# Patient Record
Sex: Male | Born: 2000 | Hispanic: No | Marital: Single | Smoking: Former smoker
Health system: Southern US, Community
[De-identification: ages and names within clinical notes are randomized; demographics above are authoritative.]

## PROBLEM LIST (undated history)

## (undated) DIAGNOSIS — J45909 Unspecified asthma, uncomplicated: Secondary | ICD-10-CM

---
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
## 2022-01-19 ENCOUNTER — Encounter: Payer: Self-pay | Admitting: Family Medicine

## 2022-01-19 ENCOUNTER — Ambulatory Visit (INDEPENDENT_AMBULATORY_CARE_PROVIDER_SITE_OTHER): Payer: Medicaid Other | Admitting: Family Medicine

## 2022-01-19 VITALS — BP 108/58 | HR 86 | Temp 97.6°F | Ht 74.0 in | Wt 139.0 lb

## 2022-01-19 DIAGNOSIS — Z9189 Other specified personal risk factors, not elsewhere classified: Secondary | ICD-10-CM

## 2022-01-19 DIAGNOSIS — Z8709 Personal history of other diseases of the respiratory system: Secondary | ICD-10-CM | POA: Diagnosis not present

## 2022-01-19 DIAGNOSIS — Z7689 Persons encountering health services in other specified circumstances: Secondary | ICD-10-CM

## 2022-01-19 NOTE — Progress Notes (Signed)
New Patient Office Visit  Subjective    Patient ID: Eric Reeves, male    DOB: [birth year]  Age: 21 y.o. MRN: [id number]  CC:  Chief Complaint  Patient presents with   Establish Care    Needs referral to orthodontist for braces    HPI Eric Reeves presents to establish care.  States he would like to get braces and requests referral to orthodontist.   Asthma as a child. No issues in years.    Works as Lawyer at [employer] Medications as of 01/19/2022  Medication Sig   [DISCONTINUED] albuterol (PROVENTIL HFA;VENTOLIN HFA) 108 (90 BASE) MCG/ACT inhaler Inhale 2 puffs into the lungs every 6 (six) hours as needed for wheezing.   [DISCONTINUED] albuterol (PROVENTIL HFA;VENTOLIN HFA) 108 (90 Base) MCG/ACT inhaler Inhale 1-2 puffs into the lungs every 6 (six) hours as needed for wheezing or shortness of breath.   [DISCONTINUED] albuterol (PROVENTIL) (5 MG/ML) 0.5% nebulizer solution Take 2.5 mg by nebulization every 6 (six) hours as needed for wheezing or shortness of breath.   [DISCONTINUED] loratadine (CLARITIN) 10 MG tablet Take 10 mg by mouth daily.   No facility-administered encounter medications on file as of 01/19/2022.    Past Medical History:  Diagnosis Date   Asthma     History reviewed. No pertinent surgical history.  Family History  Problem Relation Age of Onset   Asthma Father    Diabetes Maternal Grandmother    Hypertension Maternal Grandmother    Asthma Maternal Grandmother     Social History   Socioeconomic History   Marital status: Single    Spouse name: Not on file   Number of children: Not on file   Years of education: Not on file   Highest education level: Not on file  Occupational History   Not on file  Tobacco Use   Smoking status: Every Day    Types: Cigarettes   Smokeless tobacco: Never  Vaping Use   Vaping Use: Every day  Substance and Sexual Activity   Alcohol use: Never   Drug use: Not Currently     Types: Marijuana   Sexual activity: Yes    Birth control/protection: None  Other Topics Concern   Not on file  Social History Narrative   ** Merged History Encounter **       Social Determinants of Health   Financial Resource Strain: Not on file  Food Insecurity: Not on file  Transportation Needs: Not on file  Physical Activity: Not on file  Stress: Not on file  Social Connections: Not on file  Intimate Partner Violence: Not on file    ROS      Objective    BP (!) 108/58 (BP Location: Left Arm, Patient Position: Sitting, Cuff Size: Large)   Pulse 86   Temp 97.6 F (36.4 C) (Temporal)   Ht 6\' 2"  (1.88 m)   Wt 139 lb (63 kg)   BMI 17.85 kg/m   Physical Exam Constitutional:      General: He is not in acute distress.    Appearance: He is not ill-appearing.  HENT:     Mouth/Throat:     Lips: Pink. No lesions.     Mouth: Mucous membranes are moist.     Dentition: Abnormal dentition.     Pharynx: Oropharynx is clear.     Comments: 2 lower teeth are malaligned. No sign of caries or other abnormalities.  Eyes:     Conjunctiva/sclera: Conjunctivae normal.  Cardiovascular:     Rate and Rhythm: Normal rate and regular rhythm.  Pulmonary:     Effort: Pulmonary effort is normal.     Breath sounds: Normal breath sounds.  Musculoskeletal:     Cervical back: Normal range of motion and neck supple.  Lymphadenopathy:     Cervical: No cervical adenopathy.  Neurological:     Mental Status: He is alert.         Assessment & Plan:   Problem List Items Addressed This Visit       Other   History of asthma   Need for dental care - Primary   Relevant Orders   Ambulatory referral to Orthodontics   Other Visit Diagnoses     Encounter to establish care          He is a pleasant 21 year old male who is new to the practice and here to establish care.  Request referral to orthodontist and would like to get braces.  He has not seen a dentist in years and I recommend  scheduling appointment for dental exam.  His teeth appear to be in good shape but there are a couple that are malaligned. Denies any issues with asthma since childhood. Follow-up as needed.  Return if symptoms worsen or fail to improve.   Harland Dingwall, NP-C

## 2022-01-19 NOTE — Patient Instructions (Signed)
It was a pleasure meeting you today.  Thank you for trusting [place] with your health care.  I placed a referral to an orthodontist.  You may want to check with your insurance in regards to an orthodontist who is in your network.  You can call and schedule this appointment or let me know if you need the referral to a specific office.

## 2022-07-25 ENCOUNTER — Telehealth: Payer: Medicaid Other | Admitting: Physician Assistant

## 2022-07-25 DIAGNOSIS — B9689 Other specified bacterial agents as the cause of diseases classified elsewhere: Secondary | ICD-10-CM | POA: Diagnosis not present

## 2022-07-25 DIAGNOSIS — J019 Acute sinusitis, unspecified: Secondary | ICD-10-CM

## 2022-07-25 MED ORDER — FLUTICASONE PROPIONATE 50 MCG/ACT NA SUSP: 2.0000 | Freq: Every day | NASAL | 0 refills | Status: DC

## 2022-07-25 MED ORDER — DOXYCYCLINE HYCLATE 100 MG PO TABS: 100.0000 mg | ORAL_TABLET | Freq: Two times a day (BID) | ORAL | 0 refills | Status: DC

## 2022-07-25 NOTE — Patient Instructions (Signed)
Kipp Laurence, thank you for joining Piedad Climes, PA-C for today's virtual visit.  While this provider is not your primary care provider (PCP), if your PCP is located in our provider database this encounter information will be shared with them immediately following your visit.   A [facility] [place] MyChart account gives you access to today's visit and all your visits, tests, and labs performed at [facility] " click here if you don't have a [facility] MyChart account or go to [email or web address]  Consent: (Patient) Esvin Hnat provided verbal consent for this virtual visit at the beginning of the encounter.  Current Medications:  Current Outpatient Medications:    doxycycline (VIBRA-TABS) 100 MG tablet, Take 1 tablet (100 mg total) by mouth 2 (two) times daily., Disp: 20 tablet, Rfl: 0   fluticasone (FLONASE) 50 MCG/ACT nasal spray, Place 2 sprays into both nostrils daily., Disp: 16 g, Rfl: 0   Medications ordered in this encounter:  Meds ordered this encounter  Medications   doxycycline (VIBRA-TABS) 100 MG tablet    Sig: Take 1 tablet (100 mg total) by mouth 2 (two) times daily.    Dispense:  20 tablet    Refill:  0    Order Specific Question:   Supervising Provider    Answer:   Merrilee Jansky [[id number]]   fluticasone (FLONASE) 50 MCG/ACT nasal spray    Sig: Place 2 sprays into both nostrils daily.    Dispense:  16 g    Refill:  0    Order Specific Question:   Supervising Provider    Answer:   Merrilee Jansky [id number]     *If you need refills on other medications prior to your next appointment, please contact your pharmacy*  Follow-Up: Call back or seek an in-person evaluation if the symptoms worsen or if the condition fails to improve as anticipated.  [facility] [phone]  Other Instructions Please take antibiotic as directed.  Increase fluid intake.  Use Saline nasal spray.  Take a daily multivitamin.  Start an over-the-counter  antihistamine and decongestant like Claritin-D.  Start the Flonase taking as directed.  Place a humidifier in the bedroom.  Please call or return clinic if symptoms are not improving.  Sinusitis Sinusitis is redness, soreness, and swelling (inflammation) of the paranasal sinuses. Paranasal sinuses are air pockets within the bones of your face (beneath the eyes, the middle of the forehead, or above the eyes). In healthy paranasal sinuses, mucus is able to drain out, and air is able to circulate through them by way of your nose. However, when your paranasal sinuses are inflamed, mucus and air can become trapped. This can allow bacteria and other germs to grow and cause infection. Sinusitis can develop quickly and last only a short time (acute) or continue over a long period (chronic). Sinusitis that lasts for more than 12 weeks is considered chronic.  CAUSES  Causes of sinusitis include: Allergies. Structural abnormalities, such as displacement of the cartilage that separates your nostrils (deviated septum), which can decrease the air flow through your nose and sinuses and affect sinus drainage. Functional abnormalities, such as when the small hairs (cilia) that line your sinuses and help remove mucus do not work properly or are not present. SYMPTOMS  Symptoms of acute and chronic sinusitis are the same. The primary symptoms are pain and pressure around the affected sinuses. Other symptoms include: Upper toothache. Earache. Headache. Bad breath. Decreased sense of smell and taste. A cough, which worsens  when you are lying flat. Fatigue. Fever. Thick drainage from your nose, which often is green and may contain pus (purulent). Swelling and warmth over the affected sinuses. DIAGNOSIS  Your caregiver will perform a physical exam. During the exam, your caregiver may: Look in your nose for signs of abnormal growths in your nostrils (nasal polyps). Tap over the affected sinus to check for signs of  infection. View the inside of your sinuses (endoscopy) with a special imaging device with a light attached (endoscope), which is inserted into your sinuses. If your caregiver suspects that you have chronic sinusitis, one or more of the following tests may be recommended: Allergy tests. Nasal culture A sample of mucus is taken from your nose and sent to a lab and screened for bacteria. Nasal cytology A sample of mucus is taken from your nose and examined by your caregiver to determine if your sinusitis is related to an allergy. TREATMENT  Most cases of acute sinusitis are related to a viral infection and will resolve on their own within 10 days. Sometimes medicines are prescribed to help relieve symptoms (pain medicine, decongestants, nasal steroid sprays, or saline sprays).  However, for sinusitis related to a bacterial infection, your caregiver will prescribe antibiotic medicines. These are medicines that will help kill the bacteria causing the infection.  Rarely, sinusitis is caused by a fungal infection. In theses cases, your caregiver will prescribe antifungal medicine. For some cases of chronic sinusitis, surgery is needed. Generally, these are cases in which sinusitis recurs more than 3 times per year, despite other treatments. HOME CARE INSTRUCTIONS  Drink plenty of water. Water helps thin the mucus so your sinuses can drain more easily. Use a humidifier. Inhale steam 3 to 4 times a day (for example, sit in the bathroom with the shower running). Apply a warm, moist washcloth to your face 3 to 4 times a day, or as directed by your caregiver. Use saline nasal sprays to help moisten and clean your sinuses. Take over-the-counter or prescription medicines for pain, discomfort, or fever only as directed by your caregiver. SEEK IMMEDIATE MEDICAL CARE IF: You have increasing pain or severe headaches. You have nausea, vomiting, or drowsiness. You have swelling around your face. You have vision  problems. You have a stiff neck. You have difficulty breathing. MAKE SURE YOU:  Understand these instructions. Will watch your condition. Will get help right away if you are not doing well or get worse. Document Released: 03/20/2005 Document Revised: 06/12/2011 Document Reviewed: 04/04/2011 [facility] Patient Information 2014 [place], [place].    If you have been instructed to have an in-person evaluation today at a local Urgent Care facility, please use the link below. It will take you to a list of all of our available [facility], including address, phone number and hours of operation. Please do not delay care.  [facility]  If you or a family member do not have a primary care provider, use the link below to schedule a visit and establish care. When you choose a [facility] primary care physician or advanced practice provider, you gain a long-term partner in health. Find a Primary Care Provider  Learn more about [facility] in-office and virtual care options:  - Get Care Now

## 2022-07-25 NOTE — Progress Notes (Signed)
Virtual Visit Consent   Eric Reeves, you are scheduled for a virtual visit with a [facility] provider today. Just as with appointments in the office, your consent must be obtained to participate. Your consent will be active for this visit and any virtual visit you may have with one of our providers in the next 365 days. If you have a MyChart account, a copy of this consent can be sent to you electronically.  As this is a virtual visit, video technology does not allow for your provider to perform a traditional examination. This may limit your provider's ability to fully assess your condition. If your provider identifies any concerns that need to be evaluated in person or the need to arrange testing (such as labs, EKG, etc.), we will make arrangements to do so. Although advances in technology are sophisticated, we cannot ensure that it will always work on either your end or our end. If the connection with a video visit is poor, the visit may have to be switched to a telephone visit. With either a video or telephone visit, we are not always able to ensure that we have a secure connection.  By engaging in this virtual visit, you consent to the provision of healthcare and authorize for your insurance to be billed (if applicable) for the services provided during this visit. Depending on your insurance coverage, you may receive a charge related to this service.  I need to obtain your verbal consent now. Are you willing to proceed with your visit today? Eric Reeves has provided verbal consent on 07/25/2022 for a virtual visit (video or telephone). Eric Reeves, [place]  Date: 07/25/2022 12:19 PM  Virtual Visit via Video Note   I, Eric Reeves, connected with  Marv Alfrey  ([id number], 2000-10-14) on 07/25/22 at 12:00 PM EDT by a video-enabled telemedicine application and verified that I am speaking with the correct person using two identifiers.  Location: Patient: Virtual Visit Location  Patient: Home Provider: Virtual Visit Location Provider: Home Office   I discussed the limitations of evaluation and management by telemedicine and the availability of in person appointments. The patient expressed understanding and agreed to proceed.    History of Present Illness: Eric Reeves is a 22 y.o. who identifies as a male who was assigned male at birth, and is being seen today for several days of nasal and head congestion associated with sinus pressure and frontal headache.  Initially with a low-grade fever, Tmax 100 degrees.  Notes and episode of nausea and vomiting this morning.  Now with some sinus pain.  No recurrence.  Some dry cough along with this.  Denies any chest pain or shortness of breath.  Denies recent travel.  Roommate was sick last week with similar symptoms but hers got better quickly.Eric Reeves   HPI: HPI  Problems:  Patient Active Problem List   Diagnosis Date Noted   Need for dental care 01/19/2022   History of asthma 01/19/2022   Altered mental status 10/28/2012   Asthma 10/28/2012   Prolonged Q-T interval on ECG 10/28/2012   Ingestion, drug, inadvertent or accidental 10/28/2012    Allergies: No Known Allergies Medications:  Current Outpatient Medications:    doxycycline (VIBRA-TABS) 100 MG tablet, Take 1 tablet (100 mg total) by mouth 2 (two) times daily., Disp: 20 tablet, Rfl: 0   fluticasone (FLONASE) 50 MCG/ACT nasal spray, Place 2 sprays into both nostrils daily., Disp: 16 g, Rfl: 0  Observations/Objective: Patient is well-developed, well-nourished in no acute  distress.  Resting comfortably  at home.  Head is normocephalic, atraumatic.  No labored breathing.  Speech is clear and coherent with logical content.  Patient is alert and oriented at baseline.   Assessment and Plan: 1. Acute bacterial sinusitis - doxycycline (VIBRA-TABS) 100 MG tablet; Take 1 tablet (100 mg total) by mouth 2 (two) times daily.  Dispense: 20 tablet; Refill: 0 - fluticasone  (FLONASE) 50 MCG/ACT nasal spray; Place 2 sprays into both nostrils daily.  Dispense: 16 g; Refill: 0  Supportive measures and OTC medications reviewed.  Suspect episode of emesis this morning was secondary to the amount of drainage.  For this he is to start an OTC antihistamine and decongestant, along with a saline nasal rinse.  Flonase per orders.  Given now progressed to sinus pain, will start doxycycline to cover for bacterial etiology.  Work note provided.  Strict in person precautions reviewed.  Follow Up Instructions: I discussed the assessment and treatment plan with the patient. The patient was provided an opportunity to ask questions and all were answered. The patient agreed with the plan and demonstrated an understanding of the instructions.  A copy of instructions were sent to the patient via MyChart unless otherwise noted below.   The patient was advised to call back or seek an in-person evaluation if the symptoms worsen or if the condition fails to improve as anticipated.  Time:  I spent 10 minutes with the patient via telehealth technology discussing the above problems/concerns.    Eric Climes, PA-C

## 2022-08-13 ENCOUNTER — Ambulatory Visit (HOSPITAL_COMMUNITY)
Admission: EM | Admit: 2022-08-13 | Discharge: 2022-08-13 | Disposition: A | Discharge status: Home / Self Care | Payer: Medicaid Other | Attending: Internal Medicine | Admitting: Internal Medicine

## 2022-08-13 ENCOUNTER — Other Ambulatory Visit: Payer: Self-pay

## 2022-08-13 ENCOUNTER — Encounter (HOSPITAL_COMMUNITY): Payer: Self-pay | Admitting: *Deleted

## 2022-08-13 DIAGNOSIS — J028 Acute pharyngitis due to other specified organisms: Secondary | ICD-10-CM | POA: Diagnosis not present

## 2022-08-13 DIAGNOSIS — B9689 Other specified bacterial agents as the cause of diseases classified elsewhere: Secondary | ICD-10-CM | POA: Diagnosis not present

## 2022-08-13 LAB — POCT RAPID STREP A (OFFICE): Rapid Strep A Screen: NEGATIVE

## 2022-08-13 MED ORDER — AMOXICILLIN 500 MG PO CAPS: 500.0000 mg | ORAL_CAPSULE | Freq: Two times a day (BID) | ORAL | 0 refills | Status: AC

## 2022-08-13 MED ORDER — IBUPROFEN 800 MG PO TABS
800.0000 mg | ORAL_TABLET | Freq: Once | ORAL | Status: AC
  Administered 2022-08-13: 800 mg via ORAL

## 2022-08-13 MED ORDER — IBUPROFEN 800 MG PO TABS
ORAL_TABLET | ORAL | Status: AC
  Filled 2022-08-13: qty 1

## 2022-08-13 NOTE — ED Provider Notes (Signed)
[facility]    CSN: [id number] Arrival date & time: 08/13/22  1016      History   Chief Complaint Chief Complaint  Patient presents with   Sore Throat   Lymphadenopathy   Abdominal Pain   Fever    HPI Eric Reeves is a 22 y.o. male.   Patient presents to urgent care for evaluation of sore throat, swollen glands in the neck, fever/chills, and generalized abdominal discomfort for the last 6 days.  Reports intermittent nausea without vomiting as well.  Fever was 101 at highest a couple of days ago, currently afebrile at 99.2.  Reports significant chills over the last few days and generalized fatigue as well.  No cough, ear pain, headaches, recent known sick contacts with similar symptoms, or diarrhea.  Abdominal pain is generalized but has improved significantly since onset.  No urinary symptoms, back pain, flank pain, or dizziness.  Tolerating food and fluids well. Has been using hot tea for symptoms. Has not used any OTC medications.   Sore Throat Associated symptoms include abdominal pain.  Abdominal Pain Associated symptoms: fever   Fever   Past Medical History:  Diagnosis Date   Asthma     Patient Active Problem List   Diagnosis Date Noted   Need for dental care 01/19/2022   History of asthma 01/19/2022   Altered mental status 10/28/2012   Asthma 10/28/2012   Prolonged Q-T interval on ECG 10/28/2012   Ingestion, drug, inadvertent or accidental 10/28/2012    History reviewed. No pertinent surgical history.     Home Medications    Prior to Admission medications   Medication Sig Start Date End Date Taking? Authorizing Provider  amoxicillin (AMOXIL) 500 MG capsule Take 1 capsule (500 mg total) by mouth 2 (two) times daily for 10 days. 08/13/22 08/23/22 Yes Mariapaula Krist, Donavan Burnet, FNP  fluticasone (FLONASE) 50 MCG/ACT nasal spray Place 2 sprays into both nostrils daily. 07/25/22   Waldon Merl, PA-C    Family History Family History  Problem  Relation Age of Onset   Asthma Father    Diabetes Maternal Grandmother    Hypertension Maternal Grandmother    Asthma Maternal Grandmother     Social History Social History   Tobacco Use   Smoking status: Every Day    Types: Cigarettes   Smokeless tobacco: Never  Vaping Use   Vaping Use: Every day  Substance Use Topics   Alcohol use: Never   Drug use: Not Currently    Types: Marijuana     Allergies   Patient has no known allergies.   Review of Systems Review of Systems  Constitutional:  Positive for fever.  Gastrointestinal:  Positive for abdominal pain.  Per HPI   Physical Exam Triage Vital Signs ED Triage Vitals  Enc Vitals Group     BP 08/13/22 1107 113/71     Pulse Rate 08/13/22 1107 88     Resp 08/13/22 1107 16     Temp 08/13/22 1107 99.2 F (37.3 C)     Temp src --      SpO2 08/13/22 1107 98 %     Weight --      Height --      Head Circumference --      Peak Flow --      Pain Score 08/13/22 1106 10     Pain Loc --      Pain Edu? --      Excl. in GC? --  No data found.  Updated Vital Signs BP 113/71   Pulse 88   Temp 99.2 F (37.3 C)   Resp 16   SpO2 98%   Visual Acuity Right Eye Distance:   Left Eye Distance:   Bilateral Distance:    Right Eye Near:   Left Eye Near:    Bilateral Near:     Physical Exam Vitals and nursing note reviewed.  Constitutional:      Appearance: He is ill-appearing. He is not toxic-appearing.  HENT:     Head: Normocephalic and atraumatic.     Right Ear: Hearing, tympanic membrane, ear canal and external ear normal.     Left Ear: Hearing, tympanic membrane, ear canal and external ear normal.     Nose: Nose normal.     Mouth/Throat:     Lips: Pink.     Mouth: Mucous membranes are moist. No injury.     Tongue: No lesions. Tongue does not deviate from midline.     Palate: No mass and lesions.     Pharynx: Uvula midline. Pharyngeal swelling, oropharyngeal exudate and posterior oropharyngeal erythema  present. No uvula swelling.     Tonsils: Tonsillar exudate present. No tonsillar abscesses. 2+ on the right. 2+ on the left.  Eyes:     General: Lids are normal. Vision grossly intact. Gaze aligned appropriately.     Extraocular Movements: Extraocular movements intact.     Conjunctiva/sclera: Conjunctivae normal.  Cardiovascular:     Rate and Rhythm: Normal rate and regular rhythm.     Heart sounds: Normal heart sounds, S1 normal and S2 normal.  Pulmonary:     Effort: Pulmonary effort is normal. No respiratory distress.     Breath sounds: Normal breath sounds and air entry.  Abdominal:     General: Bowel sounds are normal.     Palpations: Abdomen is soft.     Tenderness: There is no abdominal tenderness. There is no right CVA tenderness, left CVA tenderness or guarding.  Musculoskeletal:     Cervical back: Neck supple.  Skin:    General: Skin is warm and dry.     Capillary Refill: Capillary refill takes less than 2 seconds.     Findings: No rash.  Neurological:     General: No focal deficit present.     Mental Status: He is alert and oriented to person, place, and time. Mental status is at baseline.     Cranial Nerves: No dysarthria or facial asymmetry.  Psychiatric:        Mood and Affect: Mood normal.        Speech: Speech normal.        Behavior: Behavior normal.        Thought Content: Thought content normal.        Judgment: Judgment normal.      UC Treatments / Results  Labs (all labs ordered are listed, but only abnormal results are displayed) Labs Reviewed  POCT RAPID STREP A (OFFICE)  POCT MONO SCREEN ([facility])    EKG   Radiology No results found.  Procedures Procedures (including critical care time)  Medications Ordered in UC Medications  ibuprofen (ADVIL) tablet 800 mg (800 mg Oral Given 08/13/22 1205)    Initial Impression / Assessment and Plan / UC Course  I have reviewed the triage vital signs and the nursing notes.  Pertinent labs & imaging  results that were available during my care of the patient were reviewed by me and considered in my  medical decision making (see chart for details).   1.  Acute bacterial pharyngitis High suspicion for bacterial pharyngitis based on symptoms and physical exam. He qualifies for empiric treatment for bacterial pharyngitis based on [facility] Criteria (score 4). Rapid strep POC test negative, throat culture pending. Amoxicillin BID for 10 days sent to be taken as prescribed. Ibuprofen given in clinic, may use ibuprofen 600mg  at home every 6 hours as needed for pain and swelling to the throat as well as fever/chills. No current evidence of peritonsillar abscess. Currently maintaining secretions without difficulty, however strict UC and ER return precautions discussed. He is agreeable with this plan.   Discussed physical exam and available lab work findings in clinic with patient.  Counseled patient regarding appropriate use of medications and potential side effects for all medications recommended or prescribed today. Discussed red flag signs and symptoms of worsening condition,when to call the PCP office, return to urgent care, and when to seek higher level of care in the emergency department. Patient verbalizes understanding and agreement with plan. All questions answered. Patient discharged in stable condition.    Final Clinical Impressions(s) / UC Diagnoses   Final diagnoses:  Acute bacterial pharyngitis     Discharge Instructions      Take amoxicillin twice a day for the next 10 days. Change your tooth brush after 2-3 days of antibiotic.  Ibuprofen 600mg  every 6 hours as needed for pain.  If you develop any new or worsening symptoms or do not improve in the next 2 to 3 days, please return.  If your symptoms are severe, please go to the emergency room.  Follow-up with your primary care provider for further evaluation and management of your symptoms as well as ongoing wellness visits.  I hope you  feel better!   ED Prescriptions     Medication Sig Dispense Auth. Provider   amoxicillin (AMOXIL) 500 MG capsule Take 1 capsule (500 mg total) by mouth 2 (two) times daily for 10 days. 20 capsule Carlisle Beers, FNP      PDMP not reviewed this encounter.   Carlisle Beers, [place] 08/15/22 2129

## 2022-08-13 NOTE — Discharge Instructions (Signed)
Take amoxicillin twice a day for the next 10 days. Change your tooth brush after 2-3 days of antibiotic.  Ibuprofen 600mg  every 6 hours as needed for pain.  If you develop any new or worsening symptoms or do not improve in the next 2 to 3 days, please return.  If your symptoms are severe, please go to the emergency room.  Follow-up with your primary care provider for further evaluation and management of your symptoms as well as ongoing wellness visits.  I hope you feel better!

## 2022-08-13 NOTE — ED Triage Notes (Signed)
Pt reports sore throat and swollen lymph nodes since Tuesday and worse Thursday.Pt also had ABD pain that resolved after taking antiacids

## 2022-08-14 LAB — CULTURE, GROUP A STREP (THRC)

## 2022-08-15 LAB — CULTURE, GROUP A STREP (THRC)

## 2022-10-16 ENCOUNTER — Telehealth: Payer: Self-pay

## 2022-10-16 NOTE — Telephone Encounter (Signed)
LVM for patient to call back [phone], or to call PCP office to schedule follow up apt. AS, CMA  

## 2022-11-30 ENCOUNTER — Other Ambulatory Visit: Payer: Self-pay

## 2022-11-30 ENCOUNTER — Ambulatory Visit
Admission: RE | Admit: 2022-11-30 | Discharge: 2022-11-30 | Disposition: A | Discharge status: Home / Self Care | Payer: Medicaid Other | Source: Ambulatory Visit | Attending: Internal Medicine | Admitting: Internal Medicine

## 2022-11-30 VITALS — BP 109/68 | HR 77 | Temp 98.2°F | Resp 16

## 2022-11-30 DIAGNOSIS — B349 Viral infection, unspecified: Secondary | ICD-10-CM | POA: Insufficient documentation

## 2022-11-30 DIAGNOSIS — Z20822 Contact with and (suspected) exposure to covid-19: Secondary | ICD-10-CM | POA: Insufficient documentation

## 2022-11-30 MED ORDER — PROMETHAZINE-DM 6.25-15 MG/5ML PO SYRP: 5.0000 mL | ORAL_SOLUTION | Freq: Four times a day (QID) | ORAL | 0 refills | Status: DC | PRN

## 2022-11-30 NOTE — Discharge Instructions (Signed)
The clinic will contact you with results of the COVID test done today if positive.  Start Promethazine DM as needed for cough.  Please note this medication can make you drowsy.  Do not drink alcohol or drive while you are on this medication.  Lots of rest and fluids.  Please follow-up with your PCP in 2 days for recheck.  Please go to the ER for any worsening symptoms.  I hope you feel better soon!

## 2022-11-30 NOTE — ED Triage Notes (Signed)
Pt presents to UC w/ c/o fever, cough, fatigue x3 days. Pt states there has been a "covid outbreak" at his job. Pt has taken tylenol for fever.

## 2022-11-30 NOTE — ED Provider Notes (Signed)
[facility]    CSN: [id number] Arrival date & time: 11/30/22  1020      History   Chief Complaint Chief Complaint  Patient presents with   Cough    HPI Eric Reeves is a 22 y.o. male  presents for evaluation of URI symptoms for 2 days. Patient reports associated symptoms of fever, cough, fatigue, sore throat, congestion. Denies N/V/D, ear pain, body aches, shortness of breath. Patient does not have a hx of asthma or smoking.  Reports COVID outbreak at work.  He has had COVID in the past without hospitalization or complication.  States he is vaccinated for COVID.  Pt has taken Tylenol OTC for symptoms. Pt has no other concerns at this time.    Cough Associated symptoms: fever and sore throat     Past Medical History:  Diagnosis Date   Asthma     Patient Active Problem List   Diagnosis Date Noted   Need for dental care 01/19/2022   History of asthma 01/19/2022   Altered mental status 10/28/2012   Asthma 10/28/2012   Prolonged Q-T interval on ECG 10/28/2012   Ingestion, drug, inadvertent or accidental 10/28/2012    History reviewed. No pertinent surgical history.     Home Medications    Prior to Admission medications   Medication Sig Start Date End Date Taking? Authorizing Provider  promethazine-dextromethorphan (PROMETHAZINE-DM) 6.25-15 MG/5ML syrup Take 5 mLs by mouth 4 (four) times daily as needed for cough. 11/30/22  Yes Radford Pax, NP  fluticasone (FLONASE) 50 MCG/ACT nasal spray Place 2 sprays into both nostrils daily. 07/25/22   Waldon Merl, PA-C    Family History Family History  Problem Relation Age of Onset   Asthma Father    Diabetes Maternal Grandmother    Hypertension Maternal Grandmother    Asthma Maternal Grandmother     Social History Social History   Tobacco Use   Smoking status: Former    Types: Cigarettes   Smokeless tobacco: Never  Vaping Use   Vaping status: Every Day  Substance Use Topics   Alcohol use: Never    Drug use: Not Currently    Types: Marijuana     Allergies   Patient has no known allergies.   Review of Systems Review of Systems  Constitutional:  Positive for fever.  HENT:  Positive for congestion and sore throat.   Respiratory:  Positive for cough.      Physical Exam Triage Vital Signs ED Triage Vitals  Encounter Vitals Group     BP 11/30/22 1030 109/68     Systolic BP Percentile --      Diastolic BP Percentile --      Pulse Rate 11/30/22 1030 77     Resp 11/30/22 1030 16     Temp 11/30/22 1030 98.2 F (36.8 C)     Temp Source 11/30/22 1030 Oral     SpO2 11/30/22 1030 98 %     Weight --      Height --      Head Circumference --      Peak Flow --      Pain Score 11/30/22 1032 0     Pain Loc --      Pain Education --      Exclude from Growth Chart --    No data found.  Updated Vital Signs BP 109/68 (BP Location: Right Arm)   Pulse 77   Temp 98.2 F (36.8 C) (Oral)   Resp 16  SpO2 98%   Visual Acuity Right Eye Distance:   Left Eye Distance:   Bilateral Distance:    Right Eye Near:   Left Eye Near:    Bilateral Near:     Physical Exam Vitals and nursing note reviewed.  Constitutional:      General: He is not in acute distress.    Appearance: Normal appearance. He is not ill-appearing or toxic-appearing.  HENT:     Head: Normocephalic and atraumatic.     Right Ear: Tympanic membrane and ear canal normal.     Left Ear: Tympanic membrane and ear canal normal.     Nose: Congestion present.     Mouth/Throat:     Mouth: Mucous membranes are moist.     Pharynx: Posterior oropharyngeal erythema present.  Eyes:     Pupils: Pupils are equal, round, and reactive to light.  Cardiovascular:     Rate and Rhythm: Normal rate and regular rhythm.     Heart sounds: Normal heart sounds.  Pulmonary:     Effort: Pulmonary effort is normal.     Breath sounds: Normal breath sounds.  Musculoskeletal:     Cervical back: Normal range of motion and neck  supple.  Lymphadenopathy:     Cervical: No cervical adenopathy.  Skin:    General: Skin is warm and dry.  Neurological:     General: No focal deficit present.     Mental Status: He is alert and oriented to person, place, and time.  Psychiatric:        Mood and Affect: Mood normal.        Behavior: Behavior normal.      UC Treatments / Results  Labs (all labs ordered are listed, but only abnormal results are displayed) Labs Reviewed  SARS CORONAVIRUS 2 (TAT 6-24 HRS)    EKG   Radiology No results found.  Procedures Procedures (including critical care time)  Medications Ordered in UC Medications - No data to display  Initial Impression / Assessment and Plan / UC Course  I have reviewed the triage vital signs and the nursing notes.  Pertinent labs & imaging results that were available during my care of the patient were reviewed by me and considered in my medical decision making (see chart for details).     Reviewed exam and symptoms with patient.  No red flags.  COVID PCR and will contact if positive.  Discussed viral illness and symptomatic treatment.  Promethazine DM as needed for cough.  Side effect profile reviewed.  PCP follow-up in 2 days for recheck.  ER precautions reviewed and patient verbalized understanding. Final Clinical Impressions(s) / UC Diagnoses   Final diagnoses:  Exposure to COVID-19 virus  Viral illness     Discharge Instructions      The clinic will contact you with results of the COVID test done today if positive.  Start Promethazine DM as needed for cough.  Please note this medication can make you drowsy.  Do not drink alcohol or drive while you are on this medication.  Lots of rest and fluids.  Please follow-up with your PCP in 2 days for recheck.  Please go to the ER for any worsening symptoms.  I hope you feel better soon!   ED Prescriptions     Medication Sig Dispense Auth. Provider   promethazine-dextromethorphan (PROMETHAZINE-DM)  6.25-15 MG/5ML syrup Take 5 mLs by mouth 4 (four) times daily as needed for cough. 118 mL Radford Pax, NP  PDMP not reviewed this encounter.   Radford Pax, NP 11/30/22 1053

## 2022-12-01 LAB — SARS CORONAVIRUS 2 (TAT 6-24 HRS): SARS Coronavirus 2: NEGATIVE

## 2023-01-17 ENCOUNTER — Ambulatory Visit: Payer: Self-pay | Admitting: Family Medicine

## 2023-02-07 ENCOUNTER — Telehealth: Payer: Self-pay | Admitting: Physician Assistant

## 2023-02-07 DIAGNOSIS — A084 Viral intestinal infection, unspecified: Secondary | ICD-10-CM

## 2023-02-07 NOTE — Progress Notes (Signed)
Virtual Visit Consent   Eric Reeves, you are scheduled for a virtual visit with a [facility] provider today. Just as with appointments in the office, your consent must be obtained to participate. Your consent will be active for this visit and any virtual visit you may have with one of our providers in the next 365 days. If you have a MyChart account, a copy of this consent can be sent to you electronically.  As this is a virtual visit, video technology does not allow for your provider to perform a traditional examination. This may limit your provider's ability to fully assess your condition. If your provider identifies any concerns that need to be evaluated in person or the need to arrange testing (such as labs, EKG, etc.), we will make arrangements to do so. Although advances in technology are sophisticated, we cannot ensure that it will always work on either your end or our end. If the connection with a video visit is poor, the visit may have to be switched to a telephone visit. With either a video or telephone visit, we are not always able to ensure that we have a secure connection.  By engaging in this virtual visit, you consent to the provision of healthcare and authorize for your insurance to be billed (if applicable) for the services provided during this visit. Depending on your insurance coverage, you may receive a charge related to this service.  I need to obtain your verbal consent now. Are you willing to proceed with your visit today? Eric Reeves has provided verbal consent on 02/07/2023 for a virtual visit (video or telephone). Piedad Climes, [place]  Date: 02/07/2023 2:31 PM  Virtual Visit via Video Note   I, Piedad Climes, connected with  Eric Reeves  ([id number], 2000/04/23) on 02/07/23 at  2:30 PM EST by a video-enabled telemedicine application and verified that I am speaking with the correct person using two identifiers.  Location: Patient: Virtual Visit Location  Patient: Home Provider: Virtual Visit Location Provider: Home Office   I discussed the limitations of evaluation and management by telemedicine and the availability of in person appointments. The patient expressed understanding and agreed to proceed.    History of Present Illness: Eric Reeves is a 22 y.o. who identifies as a male who was assigned male at birth, and is being seen today for GI symptoms starting yesterday while at work noting feeling some abdominal cramping followed by cough and a few episodes of non-bloody emesis. Ran a low-grade fever yesterday that has resolved. Last episode of emesis 7AM. Some residual nausea and abdominal cramping.  Some associated diarrhea. Has been having some issue with nasal congestion and PND prior that is better but unsure if related. No known sick contact. Is tolerating PO fluids without issue.    HPI: HPI  Problems:  Patient Active Problem List   Diagnosis Date Noted   Need for dental care 01/19/2022   History of asthma 01/19/2022   Altered mental status 10/28/2012   Asthma 10/28/2012   Prolonged Q-T interval on ECG 10/28/2012   Ingestion, drug, inadvertent or accidental 10/28/2012    Allergies: No Known Allergies Medications: No current outpatient medications on file.  Observations/Objective: Patient is well-developed, well-nourished in no acute distress.  Resting comfortably at home.  Head is normocephalic, atraumatic.  No labored breathing. Speech is clear and coherent with logical content.  Patient is alert and oriented at baseline.   Assessment and Plan: 1. Viral gastroenteritis  Supportive measures and OTC  medications reviewed.  Imodium OTC. Start [employer] Will avoid Zofran or promethazine giving documented history of prolonged QT interval on EKG.  In person evaluation precautions reviewed with patient. +  Follow Up Instructions: I discussed the assessment and treatment plan with the patient. The patient was provided an  opportunity to ask questions and all were answered. The patient agreed with the plan and demonstrated an understanding of the instructions.  A copy of instructions were sent to the patient via MyChart unless otherwise noted below.   The patient was advised to call back or seek an in-person evaluation if the symptoms worsen or if the condition fails to improve as anticipated.    Piedad Climes, PA-C

## 2023-02-07 NOTE — Patient Instructions (Signed)
Kipp Laurence, thank you for joining Piedad Climes, PA-C for today's virtual visit.  While this provider is not your primary care provider (PCP), if your PCP is located in our provider database this encounter information will be shared with them immediately following your visit.   A [facility] MyChart account gives you access to today's visit and all your visits, tests, and labs performed at [facility] " click here if you don't have a [facility] MyChart account or go to [email or web address]  Consent: (Patient) Eric Reeves provided verbal consent for this virtual visit at the beginning of the encounter.  Current Medications:  Current Outpatient Medications:    fluticasone (FLONASE) 50 MCG/ACT nasal spray, Place 2 sprays into both nostrils daily., Disp: 16 g, Rfl: 0   promethazine-dextromethorphan (PROMETHAZINE-DM) 6.25-15 MG/5ML syrup, Take 5 mLs by mouth 4 (four) times daily as needed for cough., Disp: 118 mL, Rfl: 0   Medications ordered in this encounter:  No orders of the defined types were placed in this encounter.    *If you need refills on other medications prior to your next appointment, please contact your pharmacy*  Follow-Up: Call back or seek an in-person evaluation if the symptoms worsen or if the condition fails to improve as anticipated.  [facility] [phone]  Other Instructions Food Choices to Help Relieve Diarrhea, Adult Diarrhea can make you feel weak and cause you to become dehydrated. Dehydration is a condition in which there is not enough water or other fluids in the body. It is important to choose the right foods and drinks to: Relieve diarrhea. Replace lost fluids and nutrients. Prevent dehydration. What are tips for following this plan? Relieving diarrhea Avoid foods that make your diarrhea worse. These may include: Foods and drinks that are sweetened with high-fructose corn syrup, honey, or sweeteners such as  xylitol, sorbitol, and mannitol. Check food labels for these ingredients. Fried, greasy, or spicy foods. Raw fruits and vegetables. Eat foods that are rich in probiotics. These include foods such as yogurt and fermented milk products. Probiotics can help increase healthy bacteria in your stomach and intestines (gastrointestinal or GI tract). This may help digestion and stop diarrhea. If you have lactose intolerance, avoid dairy products. These may make your diarrhea worse. Take medicine to help stop diarrhea only as told by your health care provider. Replacing nutrients  Eat bland, easy-to-digest foods in small amounts as you are able, until your diarrhea starts to get better. These foods include bananas, applesauce, rice, toast, and crackers. Over time, add nutrient-rich foods as your body tolerates them or as told by your health care provider. These include: Well-cooked protein foods, such as eggs, lean meats like fish or chicken without skin, and tofu. Peeled, seeded, and soft-cooked fruits and vegetables. Low-fat dairy products. Whole grains. Take vitamin and mineral supplements as told by your health care provider. Preventing dehydration  Start by sipping water or a solution to prevent dehydration (oral rehydration solution, or ORS). This is a drink that helps replace fluids and minerals your body has lost. You can buy an ORS at pharmacies and retail stores. Try to drink at least 8-10 cups (2,000-2,500 mL) of fluid each day to help replace lost fluids. If your urine is pale yellow, you are getting enough fluids. You may drink other liquids in addition to water, such as fruit juice that you have added water to (diluted fruit juice) or low-calorie sports drinks, as tolerated or as told by your health care  provider. Avoid drinks with caffeine, such as coffee, tea, or soft drinks. Avoid alcohol. This information is not intended to replace advice given to you by your health care provider. Make  sure you discuss any questions you have with your health care provider. Document Revised: 09/06/2021 Document Reviewed: 09/06/2021 Elsevier Patient Education  2024 Elsevier Inc.    If you have been instructed to have an in-person evaluation today at a local Urgent Care facility, please use the link below. It will take you to a list of all of our available [facility], including address, phone number and hours of operation. Please do not delay care.  [facility]  If you or a family member do not have a primary care provider, use the link below to schedule a visit and establish care. When you choose a [facility] primary care physician or advanced practice provider, you gain a long-term partner in health. Find a Primary Care Provider  Learn more about [facility]'s in-office and virtual care options: [facility] - Get Care Now

## 2023-04-25 ENCOUNTER — Telehealth: Payer: Self-pay | Admitting: Physician Assistant

## 2023-04-25 DIAGNOSIS — J019 Acute sinusitis, unspecified: Secondary | ICD-10-CM

## 2023-04-25 DIAGNOSIS — B9689 Other specified bacterial agents as the cause of diseases classified elsewhere: Secondary | ICD-10-CM

## 2023-04-25 MED ORDER — BENZONATATE 100 MG PO CAPS: 100.0000 mg | ORAL_CAPSULE | Freq: Three times a day (TID) | ORAL | 0 refills | Status: DC | PRN

## 2023-04-25 MED ORDER — AMOXICILLIN-POT CLAVULANATE 875-125 MG PO TABS: 1.0000 | ORAL_TABLET | Freq: Two times a day (BID) | ORAL | 0 refills | Status: DC

## 2023-04-25 NOTE — Patient Instructions (Signed)
Kipp Laurence, thank you for joining Piedad Climes, PA-C for today's virtual visit.  While this provider is not your primary care provider (PCP), if your PCP is located in our provider database this encounter information will be shared with them immediately following your visit.   A [facility] MyChart account gives you access to today's visit and all your visits, tests, and labs performed at [facility] " click here if you don't have a [facility] MyChart account or go to [email or web address]  Consent: (Patient) Eric Reeves provided verbal consent for this virtual visit at the beginning of the encounter.  Current Medications: No current outpatient medications on file.   Medications ordered in this encounter:  No orders of the defined types were placed in this encounter.    *If you need refills on other medications prior to your next appointment, please contact your pharmacy*  Follow-Up: Call back or seek an in-person evaluation if the symptoms worsen or if the condition fails to improve as anticipated.  [facility] [phone]  Other Instructions Please take antibiotic as directed.  Increase fluid intake.  Use Saline nasal spray.  Take a daily multivitamin. Use the TE[place]on as directed for cough.  Place a humidifier in the bedroom.  Please call or return clinic if symptoms are not improving.  Sinusitis Sinusitis is redness, soreness, and swelling (inflammation) of the paranasal sinuses. Paranasal sinuses are air pockets within the bones of your face (beneath the eyes, the middle of the forehead, or above the eyes). In healthy paranasal sinuses, mucus is able to drain out, and air is able to circulate through them by way of your nose. However, when your paranasal sinuses are inflamed, mucus and air can become trapped. This can allow bacteria and other germs to grow and cause infection. Sinusitis can develop quickly and last only a short time  (acute) or continue over a long period (chronic). Sinusitis that lasts for more than 12 weeks is considered chronic.  CAUSES  Causes of sinusitis include: Allergies. Structural abnormalities, such as displacement of the cartilage that separates your nostrils (deviated septum), which can decrease the air flow through your nose and sinuses and affect sinus drainage. Functional abnormalities, such as when the small hairs (cilia) that line your sinuses and help remove mucus do not work properly or are not present. SYMPTOMS  Symptoms of acute and chronic sinusitis are the same. The primary symptoms are pain and pressure around the affected sinuses. Other symptoms include: Upper toothache. Earache. Headache. Bad breath. Decreased sense of smell and taste. A cough, which worsens when you are lying flat. Fatigue. Fever. Thick drainage from your nose, which often is green and may contain pus (purulent). Swelling and warmth over the affected sinuses. DIAGNOSIS  Your caregiver will perform a physical exam. During the exam, your caregiver may: Look in your nose for signs of abnormal growths in your nostrils (nasal polyps). Tap over the affected sinus to check for signs of infection. View the inside of your sinuses (endoscopy) with a special imaging device with a light attached (endoscope), which is inserted into your sinuses. If your caregiver suspects that you have chronic sinusitis, one or more of the following tests may be recommended: Allergy tests. Nasal culture A sample of mucus is taken from your nose and sent to a lab and screened for bacteria. Nasal cytology A sample of mucus is taken from your nose and examined by your caregiver to determine if your sinusitis is related to  an allergy. TREATMENT  Most cases of acute sinusitis are related to a viral infection and will resolve on their own within 10 days. Sometimes medicines are prescribed to help relieve symptoms (pain medicine,  decongestants, nasal steroid sprays, or saline sprays).  However, for sinusitis related to a bacterial infection, your caregiver will prescribe antibiotic medicines. These are medicines that will help kill the bacteria causing the infection.  Rarely, sinusitis is caused by a fungal infection. In theses cases, your caregiver will prescribe antifungal medicine. For some cases of chronic sinusitis, surgery is needed. Generally, these are cases in which sinusitis recurs more than 3 times per year, despite other treatments. HOME CARE INSTRUCTIONS  Drink plenty of water. Water helps thin the mucus so your sinuses can drain more easily. Use a humidifier. Inhale steam 3 to 4 times a day (for example, sit in the bathroom with the shower running). Apply a warm, moist washcloth to your face 3 to 4 times a day, or as directed by your caregiver. Use saline nasal sprays to help moisten and clean your sinuses. Take over-the-counter or prescription medicines for pain, discomfort, or fever only as directed by your caregiver. SEEK IMMEDIATE MEDICAL CARE IF: You have increasing pain or severe headaches. You have nausea, vomiting, or drowsiness. You have swelling around your face. You have vision problems. You have a stiff neck. You have difficulty breathing. MAKE SURE YOU:  Understand these instructions. Will watch your condition. Will get help right away if you are not doing well or get worse. Document Released: 03/20/2005 Document Revised: 06/12/2011 Document Reviewed: 04/04/2011 [facility] Patient Information 2014 [place], [place].    If you have been instructed to have an in-person evaluation today at a local Urgent Care facility, please use the link below. It will take you to a list of all of our available [facility], including address, phone number and hours of operation. Please do not delay care.  [facility]  If you or a family member do not have a primary care provider,  use the link below to schedule a visit and establish care. When you choose a [facility] primary care physician or advanced practice provider, you gain a long-term partner in health. Find a Primary Care Provider  Learn more about [facility]'s in-office and virtual care options:  - Get Care Now

## 2023-04-25 NOTE — Progress Notes (Signed)
Virtual Visit Consent   Eric Reeves, you are scheduled for a virtual visit with a [facility] provider today. Just as with appointments in the office, your consent must be obtained to participate. Your consent will be active for this visit and any virtual visit you may have with one of our providers in the next 365 days. If you have a MyChart account, a copy of this consent can be sent to you electronically.  As this is a virtual visit, video technology does not allow for your provider to perform a traditional examination. This may limit your provider's ability to fully assess your condition. If your provider identifies any concerns that need to be evaluated in person or the need to arrange testing (such as labs, EKG, etc.), we will make arrangements to do so. Although advances in technology are sophisticated, we cannot ensure that it will always work on either your end or our end. If the connection with a video visit is poor, the visit may have to be switched to a telephone visit. With either a video or telephone visit, we are not always able to ensure that we have a secure connection.  By engaging in this virtual visit, you consent to the provision of healthcare and authorize for your insurance to be billed (if applicable) for the services provided during this visit. Depending on your insurance coverage, you may receive a charge related to this service.  I need to obtain your verbal consent now. Are you willing to proceed with your visit today? Eric Reeves has provided verbal consent on 04/25/2023 for a virtual visit (video or telephone). Piedad Climes, [place]  Date: 04/25/2023 12:40 PM  Virtual Visit via Video Note   I, Piedad Climes, connected with  Eric Reeves  ([id number], 25-May-2000) on 04/25/23 at 12:45 PM EST by a video-enabled telemedicine application and verified that I am speaking with the correct person using two identifiers.  Location: Patient: Virtual Visit Location  Patient: Home Provider: Virtual Visit Location Provider: Home Office   I discussed the limitations of evaluation and management by telemedicine and the availability of in person appointments. The patient expressed understanding and agreed to proceed.    History of Present Illness: Eric Reeves is a 23 y.o. who identifies as a male who was assigned male at birth, and is being seen today for URI symptoms beginning at the start of last week with a dry but sometimes persistent cough. Over the next few days began with sinus pressure and headache along with nasal congestion. Then progressed to sinus pain and now cough is productive of thick green phlegm. Denies chest pain or SOB. Denies fever, chills, aches. Denies recent travel or sick contact.   OTC -- Cough and cold medication.  HPI: HPI  Problems:  Patient Active Problem List   Diagnosis Date Noted   Need for dental care 01/19/2022   History of asthma 01/19/2022   Altered mental status 10/28/2012   Asthma 10/28/2012   Prolonged Q-T interval on ECG 10/28/2012   Ingestion, drug, inadvertent or accidental 10/28/2012    Allergies: No Known Allergies Medications:  Current Outpatient Medications:    amoxicillin-clavulanate (AUGMENTIN) 875-125 MG tablet, Take 1 tablet by mouth 2 (two) times daily., Disp: 14 tablet, Rfl: 0   benzonatate (TESSALON) 100 MG capsule, Take 1 capsule (100 mg total) by mouth 3 (three) times daily as needed for cough., Disp: 30 capsule, Rfl: 0  Observations/Objective: Patient is well-developed, well-nourished in no acute distress.  Resting comfortably  at home.  Head is normocephalic, atraumatic.  No labored breathing. Speech is clear and coherent with logical content.  Patient is alert and oriented at baseline.   Assessment and Plan: 1. Acute bacterial sinusitis (Primary) - amoxicillin-clavulanate (AUGMENTIN) 875-125 MG tablet; Take 1 tablet by mouth 2 (two) times daily.  Dispense: 14 tablet; Refill: 0 -  benzonatate (TESSALON) 100 MG capsule; Take 1 capsule (100 mg total) by mouth 3 (three) times daily as needed for cough.  Dispense: 30 capsule; Refill: 0  Rx Augmentin.  Increase fluids.  Rest.  Saline nasal spray.  Probiotic.  Mucinex as directed.  Humidifier in bedroom. Tessalon per orders.  Call or return to clinic if symptoms are not improving.   Follow Up Instructions: I discussed the assessment and treatment plan with the patient. The patient was provided an opportunity to ask questions and all were answered. The patient agreed with the plan and demonstrated an understanding of the instructions.  A copy of instructions were sent to the patient via MyChart unless otherwise noted below.   The patient was advised to call back or seek an in-person evaluation if the symptoms worsen or if the condition fails to improve as anticipated.    Piedad Climes, PA-C

## 2023-10-18 ENCOUNTER — Encounter (HOSPITAL_BASED_OUTPATIENT_CLINIC_OR_DEPARTMENT_OTHER): Payer: Self-pay | Admitting: Emergency Medicine

## 2023-10-18 ENCOUNTER — Other Ambulatory Visit: Payer: Self-pay

## 2023-10-18 ENCOUNTER — Emergency Department (HOSPITAL_BASED_OUTPATIENT_CLINIC_OR_DEPARTMENT_OTHER): Payer: Self-pay

## 2023-10-18 ENCOUNTER — Emergency Department (HOSPITAL_BASED_OUTPATIENT_CLINIC_OR_DEPARTMENT_OTHER)
Admission: EM | Admit: 2023-10-18 | Discharge: 2023-10-18 | Disposition: A | Discharge status: Home / Self Care | Payer: Self-pay | Attending: Emergency Medicine | Admitting: Emergency Medicine

## 2023-10-18 DIAGNOSIS — D72829 Elevated white blood cell count, unspecified: Secondary | ICD-10-CM | POA: Insufficient documentation

## 2023-10-18 DIAGNOSIS — J45909 Unspecified asthma, uncomplicated: Secondary | ICD-10-CM | POA: Insufficient documentation

## 2023-10-18 DIAGNOSIS — M546 Pain in thoracic spine: Secondary | ICD-10-CM | POA: Insufficient documentation

## 2023-10-18 DIAGNOSIS — M545 Low back pain, unspecified: Secondary | ICD-10-CM | POA: Insufficient documentation

## 2023-10-18 LAB — CBC WITH DIFFERENTIAL/PLATELET
Abs Immature Granulocytes: 0.04 K/uL (ref 0.00–0.07)
Basophils Absolute: 0.1 K/uL (ref 0.0–0.1)
Basophils Relative: 0 %
Eosinophils Absolute: 0.1 K/uL (ref 0.0–0.5)
Eosinophils Relative: 0 %
HCT: 35.2 % — ABNORMAL LOW (ref 39.0–52.0)
Hemoglobin: 11.8 g/dL — ABNORMAL LOW (ref 13.0–17.0)
Immature Granulocytes: 0 %
Lymphocytes Relative: 30 %
Lymphs Abs: 3.8 K/uL (ref 0.7–4.0)
MCH: 30.1 pg (ref 26.0–34.0)
MCHC: 33.5 g/dL (ref 30.0–36.0)
MCV: 89.8 fL (ref 80.0–100.0)
Monocytes Absolute: 0.9 K/uL (ref 0.1–1.0)
Monocytes Relative: 7 %
Neutro Abs: 7.6 K/uL (ref 1.7–7.7)
Neutrophils Relative %: 63 %
Platelets: 409 K/uL — ABNORMAL HIGH (ref 150–400)
RBC: 3.92 MIL/uL — ABNORMAL LOW (ref 4.22–5.81)
RDW: 12 % (ref 11.5–15.5)
WBC: 12.4 K/uL — ABNORMAL HIGH (ref 4.0–10.5)
nRBC: 0 % (ref 0.0–0.2)

## 2023-10-18 LAB — COMPREHENSIVE METABOLIC PANEL WITH GFR
ALT: 10 U/L (ref 0–44)
AST: 18 U/L (ref 15–41)
Albumin: 4.4 g/dL (ref 3.5–5.0)
Alkaline Phosphatase: 73 U/L (ref 38–126)
Anion gap: 13 (ref 5–15)
BUN: 11 mg/dL (ref 6–20)
CO2: 24 mmol/L (ref 22–32)
Calcium: 9.4 mg/dL (ref 8.9–10.3)
Chloride: 100 mmol/L (ref 98–111)
Creatinine, Ser: 0.79 mg/dL (ref 0.61–1.24)
GFR, Estimated: >60 mL/min (ref >60)
Glucose, Bld: 83 mg/dL (ref 70–99)
Potassium: 3.5 mmol/L (ref 3.5–5.1)
Sodium: 137 mmol/L (ref 135–145)
Total Bilirubin: 0.5 mg/dL (ref 0.0–1.2)
Total Protein: 7.2 g/dL (ref 6.5–8.1)

## 2023-10-18 LAB — URINALYSIS, ROUTINE W REFLEX MICROSCOPIC
Bilirubin Urine: NEGATIVE
Glucose, UA: NEGATIVE mg/dL
Hgb urine dipstick: NEGATIVE
Ketones, ur: 15 mg/dL — AB
Leukocytes,Ua: NEGATIVE
Nitrite: NEGATIVE
Protein, ur: NEGATIVE mg/dL
Specific Gravity, Urine: 1.018 (ref 1.005–1.030)
pH: 7 (ref 5.0–8.0)

## 2023-10-18 MED ORDER — ACETAMINOPHEN 500 MG PO TABS
1000.0000 mg | ORAL_TABLET | Freq: Once | ORAL | Status: AC
  Administered 2023-10-18: 1000 mg via ORAL
  Filled 2023-10-18: qty 2

## 2023-10-18 NOTE — Discharge Instructions (Signed)
 Please follow-up with your primary care provider in the next 48 to 72 hours.  Seek emergency care response to any new or worsening symptoms such as bowel incontinence, urinary retention, leg numbness.  Alternating between 650 mg Tylenol  and 400 mg Advil : The best way to alternate taking Acetaminophen  (example Tylenol ) and Ibuprofen  (example Advil /Motrin ) is to take them 3 hours apart. For example, if you take ibuprofen  at 6 am you can then take Tylenol  at 9 am. You can continue this regimen throughout the day, making sure you do not exceed the recommended maximum dose for each drug.

## 2023-10-18 NOTE — ED Provider Notes (Signed)
  EMERGENCY DEPARTMENT AT [facility] Provider Note   CSN: [id number] Arrival date & time: 10/18/23  1506     Patient presents with: Flank Pain   Eric Reeves is a 23 y.o. male with PMHx asthma who presents to ED concerned for low back pain. Patient endorsing the first episode of low back pain was 4 days ago while walking at work. Patient stating that pain is all across lower back - worse in right lower back. Pain eventually resolved, but then patient had sudden onset of severe low back pain again today. 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 Patient denies recent trauma or lifting heavy objects. Denies urinary complaint such as hematuria or dysuria. Endorses an episode of diarrhea 2 days ago which has since resolved. Last BM this morning. Patient has not taken any medications for his pain recently.   Denies fever, nausea, vomiting, hematochezia. Patient denies urinary retention, fecal incontinence, saddle anesthesia, lower extremity weakness, hx of cancer, fever, immunosuppression, IVDU, spinal procedure, significant trauma.     Flank Pain       Prior to Admission medications   Medication Sig Start Date End Date Taking? Authorizing Provider  amoxicillin -clavulanate (AUGMENTIN ) 875-125 MG tablet Take 1 tablet by mouth 2 (two) times daily. 04/25/23   Gladis Elsie BROCKS, PA-C  benzonatate  (TESSALON ) 100 MG capsule Take 1 capsule (100 mg total) by mouth 3 (three) times daily as needed for cough. 04/25/23   Gladis Elsie BROCKS, PA-C    Allergies: Patient has no known allergies.    Review of Systems  Genitourinary:  Positive for flank pain.    Updated Vital Signs BP 138/70 (BP Location: Right Arm)   Pulse 69   Temp 98.9 F (37.2 C)   Resp 18   SpO2 100%   Physical Exam Vitals and nursing note reviewed.  Constitutional:      General: He is not in acute distress.     Appearance: He is not ill-appearing or toxic-appearing.  HENT:     Head: Normocephalic and atraumatic.     Mouth/Throat:     Mouth: Mucous membranes are moist.  Eyes:     General: No scleral icterus.       Right eye: No discharge.        Left eye: No discharge.     Conjunctiva/sclera: Conjunctivae normal.  Cardiovascular:     Rate and Rhythm: Normal rate and regular rhythm.     Pulses: Normal pulses.     Heart sounds: Normal heart sounds. No murmur heard. Pulmonary:     Effort: Pulmonary effort is normal. No respiratory distress.     Breath sounds: Normal breath sounds. No wheezing, rhonchi or rales.  Abdominal:     General: Abdomen is flat. Bowel sounds are normal. There is no distension.     Palpations: Abdomen is soft. There is no mass.     Tenderness: There is no abdominal tenderness.  Musculoskeletal:     Right lower leg: No edema.     Left lower leg: No edema.     Comments: Entire low back is tender to palpation - including mild midline tenderness. No obvious swelling or erythema appreciated. No step-offs or crepitus.   Skin:    General: Skin is warm and dry.     Findings: No rash.  Neurological:     General: No focal deficit present.  Mental Status: He is alert. Mental status is at baseline.  Psychiatric:        Mood and Affect: Mood normal.     (all labs ordered are listed, but only abnormal results are displayed) Labs Reviewed  URINALYSIS, ROUTINE W REFLEX MICROSCOPIC - Abnormal; Notable for the following components:      Result Value   Ketones, ur 15 (*)    All other components within normal limits  CBC WITH DIFFERENTIAL/PLATELET - Abnormal; Notable for the following components:   WBC 12.4 (*)    RBC 3.92 (*)    Hemoglobin 11.8 (*)    HCT 35.2 (*)    Platelets 409 (*)    All other components within normal limits  COMPREHENSIVE METABOLIC PANEL WITH GFR    EKG: None  Radiology: CT Renal Stone Study Result Date: 10/18/2023 CLINICAL DATA:  Low back  pain over the last 3 days. EXAM: CT ABDOMEN AND PELVIS WITHOUT CONTRAST TECHNIQUE: Multidetector CT imaging of the abdomen and pelvis was performed following the standard protocol without IV contrast. RADIATION DOSE REDUCTION: This exam was performed according to the departmental dose-optimization program which includes automated exposure control, adjustment of the mA and/or kV according to patient size and/or use of iterative reconstruction technique. COMPARISON:  None Available. FINDINGS: Paucity of intra-fat which combined with the lack of oral and IV contrast can cause poor separation of some adjacent structures in the abdomen. Lower chest: Pleural-based 4 mm nodule along the left hemidiaphragm, image 14 series 3, clinically inconsequential given the patient's age. No further imaging workup of this lesion is indicated. Hepatobiliary: Unremarkable Pancreas: Unremarkable Spleen: Unremarkable Adrenals/Urinary Tract: Calcification posterolateral to the urinary bladder on the left on image 64 series 2 observed, unlikely to be a distal ureteral calculus, although this is not 100% certain. Other calcifications in the lower pelvis are too low in position to represent urinary tract calculi. There is no evidence of hydronephrosis or hydroureter. Adrenal glands unremarkable. Stomach/Bowel: No dilated bowel. Tubular structure below the cecum on images 30 through 41 of series 4 thought to represent normal appendix. Vascular/Lymphatic: Unremarkable Reproductive: Unremarkable Other: No supplemental non-categorized findings. Musculoskeletal: Suspicion for degenerative disc disease at all levels between L2 and S1 without specific compelling evidence of active impingement. IMPRESSION: 1. Calcification posterolateral to the urinary bladder on the left is unlikely to be a distal ureteral calculus. There is no evidence of hydronephrosis or hydroureter. 2. Suspicion for degenerative disc disease at all levels between L2 and S1  without specific compelling evidence of active impingement. If clinically indicated, lumbar spine MRI with and without contrast could be utilized to further assess. Electronically Signed   By: Ryan Salvage M.D.   On: 10/18/2023 16:52     Procedures   Medications Ordered in the ED  acetaminophen  (TYLENOL ) tablet 1,000 mg (1,000 mg Oral Given 10/18/23 1628)                                    Medical Decision Making Amount and/or Complexity of Data Reviewed Labs: ordered. Radiology: ordered.  Risk OTC drugs.   This patient presents to the ED for concern of back pain, this involves an extensive number of treatment options, and is a complaint that carries with it a high risk of complications and morbidity.  The differential diagnosis includes pyelonephritis, nephrolithiasis, spinal abscess, osteomyelitis, herniated disc, muscle strain, spinal fracture, meningitis, cancer, cauda equina syndrome.  Co morbidities that complicate the patient evaluation  asthma   Additional history obtained:  Dr. Lendia PCP   Problem List / ED Course / Critical interventions / Medication management  Patient presents to ED concerned for intermittent episodes of low back pain. Patient concerned for possible kidney stones.  Denies urinary complaint. Physical exam with tenderness to palpation of entire lower back. Rest of physical exam reassuring. Patient afebrile with stable vitals.  I Ordered, and personally interpreted labs.  UA not concerning for infection.  CBC with mild and nonspecific leukocytosis of 12.4.  Hemoglobin is also mildly low at 11.8.  CMP unremarkable. I ordered imaging studies including CT renal stone study. I independently visualized and interpreted imaging which showed possible lumbar degenerative disc disease and small area of calcification in posterior lateral bladder. I agree with the radiologist interpretation. Shared all results with patient.  Answered all questions.   Patient to follow-up with PCP.  Educated patient on alternating Tylenol  and Advil  for pain management. I have reviewed the patients home medicines and have made adjustments as needed The patient has been appropriately medically screened and/or stabilized in the ED. I have low suspicion for any other emergent medical condition which would require further screening, evaluation or treatment in the ED or require inpatient management. At time of discharge the patient is hemodynamically stable and in no acute distress. I have discussed work-up results and diagnosis with patient and answered all questions. Patient is agreeable with discharge plan. We discussed strict return precautions for returning to the emergency department and they verbalized understanding.     Social Determinants of Health:  none      Final diagnoses:  Acute bilateral low back pain without sciatica    ED Discharge Orders     None          Hoy Nidia FALCON, [place] 10/18/23 1731    Lenor Hollering, MD 10/18/23 2100

## 2023-10-18 NOTE — ED Triage Notes (Signed)
 C/o lower back pain x 3 days. Denies injuries. States worse with movement. Denies urinary symptoms.

## 2024-04-16 ENCOUNTER — Ambulatory Visit (HOSPITAL_COMMUNITY): Admission: EM | Admit: 2024-04-16 | Discharge: 2024-04-16 | Disposition: A | Discharge status: Home / Self Care

## 2024-04-16 ENCOUNTER — Encounter (HOSPITAL_COMMUNITY): Payer: Self-pay

## 2024-04-16 DIAGNOSIS — J029 Acute pharyngitis, unspecified: Secondary | ICD-10-CM | POA: Insufficient documentation

## 2024-04-16 DIAGNOSIS — J020 Streptococcal pharyngitis: Secondary | ICD-10-CM | POA: Insufficient documentation

## 2024-04-16 LAB — POC SOFIA SARS ANTIGEN FIA: SARS Coronavirus 2 Ag: NEGATIVE

## 2024-04-16 LAB — POCT INFLUENZA A/B
Influenza A, POC: NEGATIVE
Influenza B, POC: NEGATIVE

## 2024-04-16 LAB — HIV ANTIBODY (ROUTINE TESTING W REFLEX): HIV Screen 4th Generation wRfx: NONREACTIVE

## 2024-04-16 LAB — POCT RAPID STREP A (OFFICE): Rapid Strep A Screen: POSITIVE — AB

## 2024-04-16 MED ORDER — PENICILLIN V POTASSIUM 500 MG PO TABS: 500.0000 mg | ORAL_TABLET | Freq: Three times a day (TID) | ORAL | 0 refills | Status: AC

## 2024-04-16 MED ORDER — ONDANSETRON 4 MG PO TBDP: 4.0000 mg | ORAL_TABLET | Freq: Three times a day (TID) | ORAL | 0 refills | Status: AC | PRN

## 2024-04-16 NOTE — ED Triage Notes (Addendum)
 Pt has c/o body aches, sore throat, diarrhea, fever and chills x 2 days. Pt hasn't taken any medications at home for symptoms.Pt would like tested for STD, denies exposure. Would like swab and bloodwork.

## 2024-04-16 NOTE — Discharge Instructions (Addendum)
 You have strep throat.  - Take the penicillin  v (antibiotic) as directed for the next 10 days. - Change your toothbrush after 2 to 3 days of antibiotic use to prevent reinfection. - Salt water gargles, 1/2-1 teaspoon of salt dissolved in 1 cup of warm water, gargle and spit out every 4-6 hours for throat pain.  - 2 teaspoons of honey dissolved in 1 cup warm water, drink every 4-6 hours to soothe sore throat.  You may take ibuprofen  600mg  (3 over the counter 200mg  pills) every 6 hours as needed for pain and inflammation.  You may take tylenol  1,000mg  (2 over the counter extra strength 500mg  pills) every 6 hours as needed for pain.   If you develop any new or worsening symptoms or if your symptoms do not start to improve, please return here or follow-up with your primary care provider. If your symptoms are severe, please go to the emergency room.

## 2024-04-16 NOTE — ED Provider Notes (Signed)
 " [facility]    CSN: [id number] Arrival date & time: 04/16/24  1131      History   Chief Complaint Chief Complaint  Patient presents with   Sore Throat   Headache   Chills   STD testing     HPI Eric Reeves is a 24 y.o. male.   This 24 year old male is being seen for acute onset of fever, chills, body aches, headache, sore throat, nausea, diarrhea onset Monday.  He has recently traveled via airplane from [place].  He denies dizziness, ear pain, nasal congestion.  He denies chest pain, shortness of breath.  He denies vomiting.  He denies urinary symptoms including dysuria, urgency, frequency.  He is requesting full STI screening.  He denies penile discharge, penile or scrotal pain or swelling.   Sore Throat Associated symptoms include headaches. Pertinent negatives include no chest pain, no abdominal pain and no shortness of breath.  Headache Associated symptoms: diarrhea, fatigue, fever, myalgias, nausea and sore throat   Associated symptoms: no abdominal pain, no congestion, no cough, no dizziness, no ear pain and no vomiting     Past Medical History:  Diagnosis Date   Asthma     Patient Active Problem List   Diagnosis Date Noted   Need for dental care 01/19/2022   History of asthma 01/19/2022   Altered mental status 10/28/2012   Asthma 10/28/2012   Prolonged Q-T interval on ECG 10/28/2012   Ingestion, drug, inadvertent or accidental 10/28/2012    History reviewed. No pertinent surgical history.     Home Medications    Prior to Admission medications  Not on File    Family History Family History  Problem Relation Age of Onset   Asthma Father    Diabetes Maternal Grandmother    Hypertension Maternal Grandmother    Asthma Maternal Grandmother     Social History Social History[1]   Allergies   Patient has no known allergies.   Review of Systems Review of Systems  Constitutional:  Positive for activity change, appetite change,  chills, fatigue and fever.  HENT:  Positive for sore throat. Negative for congestion, ear pain and mouth sores.   Respiratory:  Negative for cough and shortness of breath.   Cardiovascular:  Negative for chest pain.  Gastrointestinal:  Positive for diarrhea and nausea. Negative for abdominal pain and vomiting.  Genitourinary:  Negative for difficulty urinating, dysuria, frequency, genital sores, penile discharge, penile pain, penile swelling, scrotal swelling and urgency.  Musculoskeletal:  Positive for myalgias.  Skin:  Negative for color change and rash.  Neurological:  Positive for headaches. Negative for dizziness.     Physical Exam Triage Vital Signs ED Triage Vitals [04/16/24 1350]  Encounter Vitals Group     BP 107/67     Girls Systolic BP Percentile      Girls Diastolic BP Percentile      Boys Systolic BP Percentile      Boys Diastolic BP Percentile      Pulse Rate 95     Resp 17     Temp 99.9 F (37.7 C)     Temp Source Oral     SpO2 98 %     Weight      Height      Head Circumference      Peak Flow      Pain Score      Pain Loc      Pain Education      Exclude from  Growth Chart    No data found.  Updated Vital Signs BP 107/67 (BP Location: Left Arm)   Pulse 95   Temp 99.9 F (37.7 C) (Oral)   Resp 17   SpO2 98%   Visual Acuity Right Eye Distance:   Left Eye Distance:   Bilateral Distance:    Right Eye Near:   Left Eye Near:    Bilateral Near:     Physical Exam Vitals and nursing note reviewed.  Constitutional:      General: He is not in acute distress.    Appearance: He is well-developed. He is not toxic-appearing.     Comments: Pleasant male appearing stated age found sitting in chair in no acute distress.  HENT:     Head: Normocephalic and atraumatic.     Right Ear: Tympanic membrane and external ear normal.     Left Ear: Tympanic membrane and external ear normal.     Nose: Nose normal.     Mouth/Throat:     Lips: Pink.     Mouth: Mucous  membranes are moist.     Pharynx: Oropharyngeal exudate and posterior oropharyngeal erythema present. No pharyngeal swelling.  Eyes:     Conjunctiva/sclera: Conjunctivae normal.  Cardiovascular:     Rate and Rhythm: Normal rate and regular rhythm.     Heart sounds: Normal heart sounds. No murmur heard. Pulmonary:     Effort: Pulmonary effort is normal. No respiratory distress.     Breath sounds: Normal breath sounds.  Abdominal:     General: Bowel sounds are normal.     Palpations: Abdomen is soft.     Tenderness: There is no abdominal tenderness.  Musculoskeletal:     Cervical back: Neck supple.  Skin:    General: Skin is warm and dry.     Capillary Refill: Capillary refill takes less than 2 seconds.  Neurological:     Mental Status: He is alert.  Psychiatric:        Mood and Affect: Mood normal.      UC Treatments / Results  Labs (all labs ordered are listed, but only abnormal results are displayed) Labs Reviewed  POCT RAPID STREP A (OFFICE) - Abnormal; Notable for the following components:      Result Value   Rapid Strep A Screen Positive (*)    All other components within normal limits  SYPHILIS: RPR W/REFLEX TO RPR TITER AND TREPONEMAL ANTIBODIES, TRADITIONAL SCREENING AND DIAGNOSIS ALGORITHM  HIV ANTIBODY (ROUTINE TESTING W REFLEX)  POC SOFIA SARS ANTIGEN FIA  POCT INFLUENZA A/B  CYTOLOGY, (ORAL, ANAL, URETHRAL) ANCILLARY ONLY    EKG   Radiology No results found.  Procedures Procedures (including critical care time)  Medications Ordered in UC Medications - No data to display  Initial Impression / Assessment and Plan / UC Course  I have reviewed the triage vital signs and the nursing notes.  Pertinent labs & imaging results that were available during my care of the patient were reviewed by me and considered in my medical decision making (see chart for details).     Vitals and triage reviewed, patient is hemodynamically stable.  COVID and flu swabs  obtained and are negative.  Strep swab positive.  Cytology swab obtained.  HIV and syphilis screening obtained as well.  Staff will contact if results are positive to initiate appropriate treatment.  He is given a prescription for penicillin  V and ondansetron .  Advised Tylenol  and/or ibuprofen , salt water gargles, honey water.  Advised to  change toothbrush in 2-3 days.  Plan of care, follow-up care, return precautions given, no questions at this time.  Work note provided. Final Clinical Impressions(s) / UC Diagnoses   Final diagnoses:  Sore throat  Strep pharyngitis   Discharge Instructions   None    ED Prescriptions   None    PDMP not reviewed this encounter.     [1]  Social History Tobacco Use   Smoking status: Former    Types: Cigarettes   Smokeless tobacco: Never  Vaping Use   Vaping status: Every Day  Substance Use Topics   Alcohol use: Never   Drug use: Not Currently    Types: Marijuana     Lennice Jon BROCKS, FNP 04/16/24 1519  "

## 2024-04-17 LAB — CYTOLOGY, (ORAL, ANAL, URETHRAL) ANCILLARY ONLY
Chlamydia: NEGATIVE
Comment: NEGATIVE
Comment: NEGATIVE
Comment: NORMAL
Neisseria Gonorrhea: NEGATIVE
Trichomonas: NEGATIVE

## 2024-04-17 LAB — SYPHILIS: RPR W/REFLEX TO RPR TITER AND TREPONEMAL ANTIBODIES, TRADITIONAL SCREENING AND DIAGNOSIS ALGORITHM: RPR Ser Ql: NONREACTIVE
